# Patient Record
Sex: Female | Born: 1965 | Race: White | Hispanic: No | Marital: Single | Smoking: Current every day smoker
Health system: Southern US, Community
[De-identification: ages and names within clinical notes are randomized; demographics above are authoritative.]

## PROBLEM LIST (undated history)

## (undated) HISTORY — PX: CHOLECYSTECTOMY: SHX55

---

## 2010-01-19 ENCOUNTER — Emergency Department: Payer: Self-pay | Admitting: Emergency Medicine

## 2010-11-04 ENCOUNTER — Inpatient Hospital Stay: Payer: Self-pay | Admitting: Internal Medicine

## 2012-02-14 IMAGING — CR DG CHEST 2V
1 series · 2 of 2 positions shown · non-contrast
Comparison: none

REASON FOR EXAM: pain
COMMENTS:

[Series 1: view not recorded · 0.17mm/px · 2 of 2 slices shown]
[im 1/2]
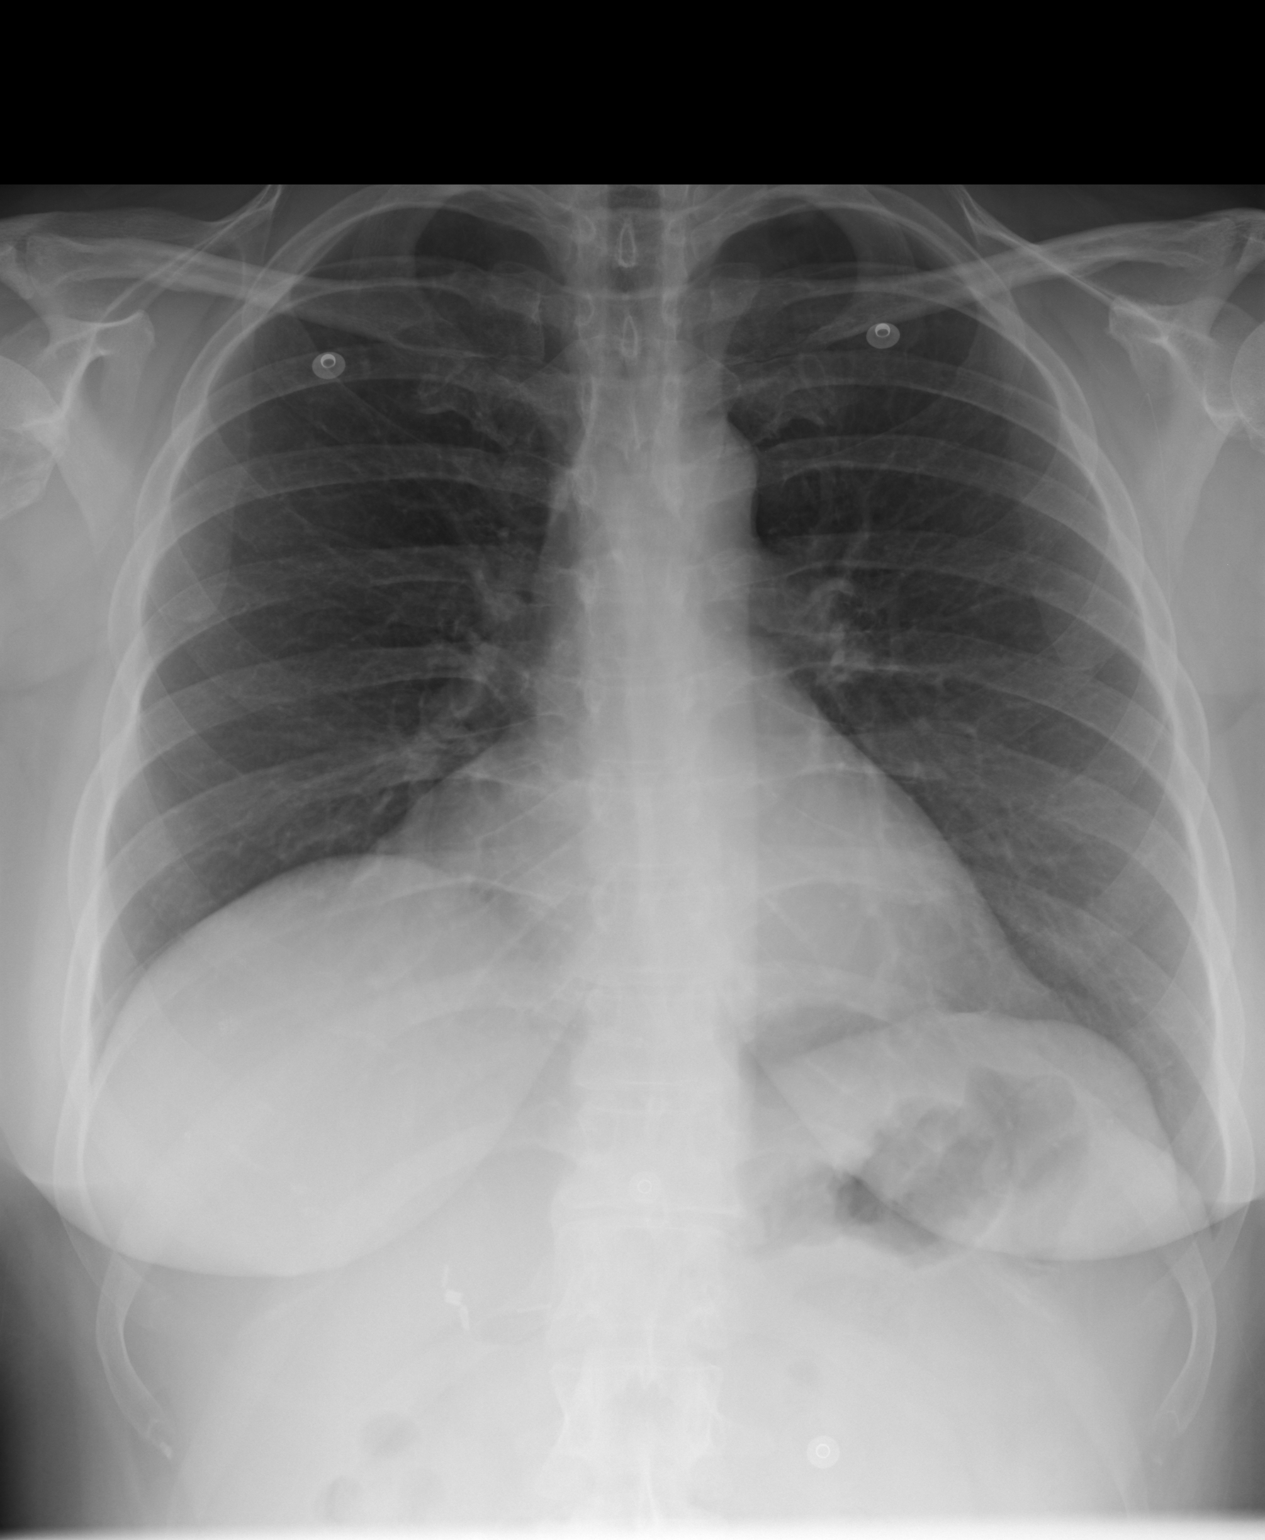
[im 2/2]
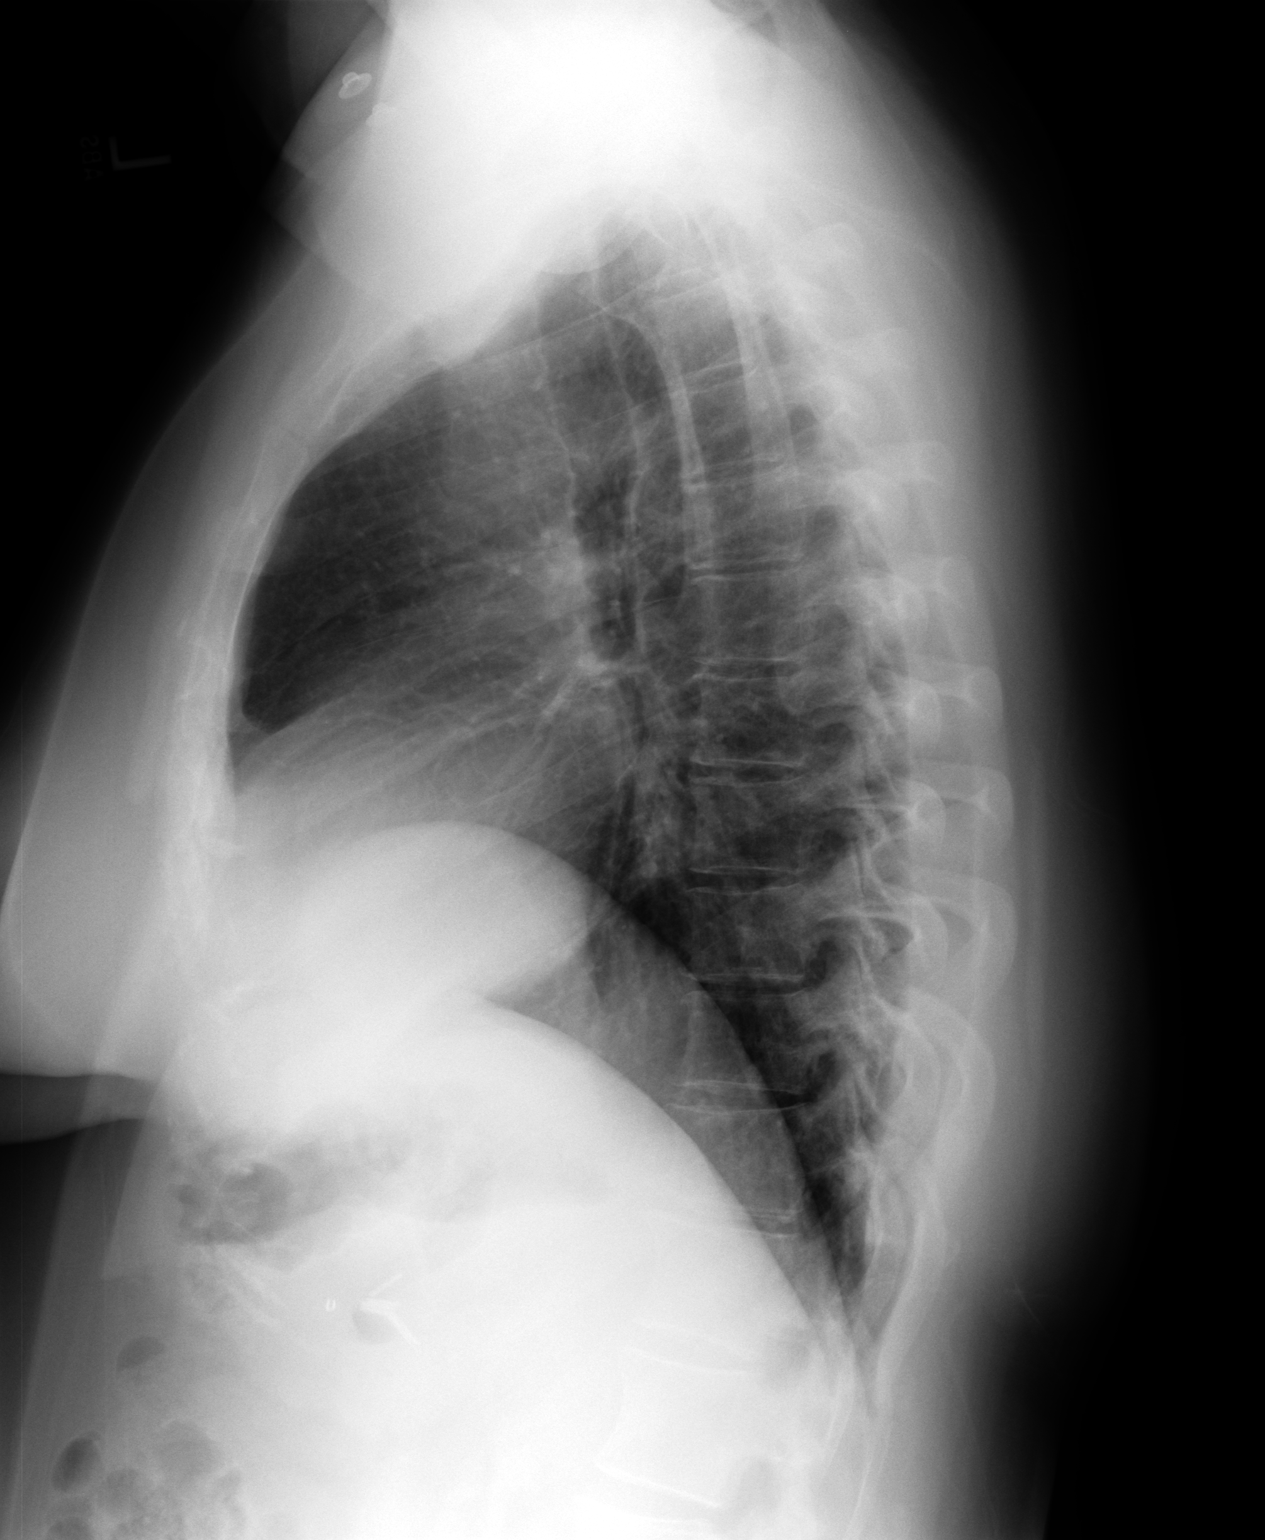

[2 of 2 positions shown; findings below may reference images not displayed]

PROCEDURE:     DXR - DXR CHEST PA (OR AP) AND LATERAL  - January 19, 2010  [DATE]

RESULT:     The patient has no previous examination for comparison.

The lungs are clear. The heart and pulmonary vessels are normal. The bony
and mediastinal structures are unremarkable. There is no effusion. There is
no pneumothorax or evidence of congestive failure.
IMPRESSION: No acute cardiopulmonary disease.

## 2014-01-08 ENCOUNTER — Emergency Department: Payer: Self-pay | Admitting: Emergency Medicine

## 2014-01-08 LAB — COMPREHENSIVE METABOLIC PANEL
ANION GAP: 6 — AB (ref 7–16)
Albumin: 3.6 g/dL (ref 3.4–5.0)
Alkaline Phosphatase: 47 U/L
BUN: 5 mg/dL — ABNORMAL LOW (ref 7–18)
Bilirubin,Total: 0.4 mg/dL (ref 0.2–1.0)
Calcium, Total: 8.2 mg/dL — ABNORMAL LOW (ref 8.5–10.1)
Chloride: 106 mmol/L (ref 98–107)
Co2: 25 mmol/L (ref 21–32)
Creatinine: 0.9 mg/dL (ref 0.60–1.30)
EGFR (African American): >60
EGFR (Non-African Amer.): >60
Glucose: 98 mg/dL (ref 65–99)
Osmolality: 271 (ref 275–301)
Potassium: 3.3 mmol/L — ABNORMAL LOW (ref 3.5–5.1)
SGOT(AST): 27 U/L (ref 15–37)
SGPT (ALT): 31 U/L (ref 12–78)
SODIUM: 137 mmol/L (ref 136–145)
Total Protein: 6.8 g/dL (ref 6.4–8.2)

## 2014-01-08 LAB — CBC
HCT: 42.8 % (ref 35.0–47.0)
HGB: 14.8 g/dL (ref 12.0–16.0)
MCH: 34 pg (ref 26.0–34.0)
MCHC: 34.7 g/dL (ref 32.0–36.0)
MCV: 98 fL (ref 80–100)
PLATELETS: 250 10*3/uL (ref 150–440)
RBC: 4.37 10*6/uL (ref 3.80–5.20)
RDW: 12.8 % (ref 11.5–14.5)
WBC: 6.8 10*3/uL (ref 3.6–11.0)

## 2014-01-08 LAB — TROPONIN I: Troponin-I: <0.02 ng/mL

## 2015-05-19 ENCOUNTER — Encounter: Payer: Self-pay | Admitting: Emergency Medicine

## 2015-05-19 ENCOUNTER — Emergency Department
Admission: EM | Admit: 2015-05-19 | Discharge: 2015-05-19 | Disposition: A | Discharge status: Home / Self Care | Payer: Self-pay | Attending: Emergency Medicine | Admitting: Emergency Medicine

## 2015-05-19 DIAGNOSIS — Z79899 Other long term (current) drug therapy: Secondary | ICD-10-CM | POA: Insufficient documentation

## 2015-05-19 DIAGNOSIS — Z3202 Encounter for pregnancy test, result negative: Secondary | ICD-10-CM | POA: Insufficient documentation

## 2015-05-19 DIAGNOSIS — Z7982 Long term (current) use of aspirin: Secondary | ICD-10-CM | POA: Insufficient documentation

## 2015-05-19 DIAGNOSIS — Z720 Tobacco use: Secondary | ICD-10-CM | POA: Insufficient documentation

## 2015-05-19 DIAGNOSIS — N12 Tubulo-interstitial nephritis, not specified as acute or chronic: Secondary | ICD-10-CM | POA: Insufficient documentation

## 2015-05-19 LAB — CBC
HEMATOCRIT: 43.6 % (ref 35.0–47.0)
HEMOGLOBIN: 14.9 g/dL (ref 12.0–16.0)
MCH: 34.1 pg — AB (ref 26.0–34.0)
MCHC: 34.2 g/dL (ref 32.0–36.0)
MCV: 99.9 fL (ref 80.0–100.0)
PLATELETS: 197 10*3/uL (ref 150–440)
RBC: 4.36 MIL/uL (ref 3.80–5.20)
RDW: 13.6 % (ref 11.5–14.5)
WBC: 8.1 10*3/uL (ref 3.6–11.0)

## 2015-05-19 LAB — URINALYSIS COMPLETE WITH MICROSCOPIC (ARMC ONLY)
BILIRUBIN URINE: NEGATIVE
Glucose, UA: NEGATIVE mg/dL
Ketones, ur: NEGATIVE mg/dL
Nitrite: NEGATIVE
Protein, ur: 30 mg/dL — AB
Specific Gravity, Urine: 1.018 (ref 1.005–1.030)
pH: 5 (ref 5.0–8.0)

## 2015-05-19 LAB — COMPREHENSIVE METABOLIC PANEL
ALBUMIN: 3.9 g/dL (ref 3.5–5.0)
ALT: 48 U/L (ref 14–54)
AST: 48 U/L — AB (ref 15–41)
Alkaline Phosphatase: 60 U/L (ref 38–126)
Anion gap: 13 (ref 5–15)
BUN: 12 mg/dL (ref 6–20)
CALCIUM: 9.2 mg/dL (ref 8.9–10.3)
CO2: 26 mmol/L (ref 22–32)
CREATININE: 1.03 mg/dL — AB (ref 0.44–1.00)
Chloride: 99 mmol/L — ABNORMAL LOW (ref 101–111)
GFR calc Af Amer: >60 mL/min (ref >60)
Glucose, Bld: 116 mg/dL — ABNORMAL HIGH (ref 65–99)
Potassium: 3.1 mmol/L — ABNORMAL LOW (ref 3.5–5.1)
Sodium: 138 mmol/L (ref 135–145)
Total Bilirubin: 0.9 mg/dL (ref 0.3–1.2)
Total Protein: 7.3 g/dL (ref 6.5–8.1)

## 2015-05-19 LAB — LIPASE, BLOOD: Lipase: 26 U/L (ref 22–51)

## 2015-05-19 LAB — POCT PREGNANCY, URINE: Preg Test, Ur: NEGATIVE

## 2015-05-19 LAB — TROPONIN I: Troponin I: <0.03 ng/mL (ref <0.031)

## 2015-05-19 MED ORDER — SULFAMETHOXAZOLE-TRIMETHOPRIM 800-160 MG PO TABS: 1.0000 | ORAL_TABLET | Freq: Two times a day (BID) | ORAL | Status: AC

## 2015-05-19 MED ORDER — CEFTRIAXONE SODIUM 1 G IJ SOLR
INTRAMUSCULAR | Status: AC
  Administered 2015-05-19: 1 g via INTRAMUSCULAR
  Filled 2015-05-19: qty 10

## 2015-05-19 MED ORDER — CEFTRIAXONE SODIUM 1 G IJ SOLR
1.0000 g | Freq: Once | INTRAMUSCULAR | Status: AC
  Administered 2015-05-19: 1 g via INTRAMUSCULAR
  Filled 2015-05-19: qty 10

## 2015-05-19 NOTE — ED Notes (Signed)
Pt to triage via w/c with no distress noted; pt reports since yesterday "feeling like I got a buzz; shocks going thru my head"; reports fever of "106" PTA; took 2ES tylenol at 0110; also c/o abd cramping, chronic lower back pain radiating into legs, dizziness, weakness

## 2015-05-19 NOTE — Discharge Instructions (Signed)
You're being treated for urinary tract/kidney infection. Return to the emergency department for any new or worsening condition including abdominal pain, inability to urinate, dizziness or passing out, confusion or altered mental status, or any other  symptoms concerning to you.   Pyelonephritis, Adult Pyelonephritis is a kidney infection. A kidney infection can happen quickly, or it can last for a long time. HOME CARE   Take your medicine (antibiotics) as told. Finish it even if you start to feel better.  Keep all doctor visits as told.  Drink enough fluids to keep your pee (urine) clear or pale yellow.  Only take medicine as told by your doctor. GET HELP RIGHT AWAY IF:   You have a fever or lasting symptoms for more than 2-3 days.  You have a fever and your symptoms suddenly get worse.  You cannot take your medicine or drink fluids as told.  You have chills and shaking.  You feel very weak or pass out (faint).  You do not feel better after 2 days. MAKE SURE YOU:  Understand these instructions.  Will watch your condition.  Will get help right away if you are not doing well or get worse. Document Released: 11/02/2004 Document Revised: 03/26/2012 Document Reviewed: 03/15/2011 [facility] Patient Information 2015 [facility] [place], [place]. This information is not intended to replace advice given to you by your health care provider. Make sure you discuss any questions you have with your health care provider.

## 2015-05-19 NOTE — ED Provider Notes (Signed)
[facility] Emergency Department Provider Note   ____________________________________________  Time seen: 7 AM I have reviewed the triage vital signs and the triage nursing note.  HISTORY  Chief Complaint Dizziness; Fever; Headache; Back Pain; and Abdominal Pain   Historian Patient  HPI Debra Drake is a 49 y.o. female who has had 2 days of fever subjectively, chills, headache, and bilateral flank pain associated also with dysuria. Last night she felt worse and she came in for evaluation. She has had urinary tract/kidney infections in the past. She has had an episode of kidney failure in the past. No shortness of breath, upper abdominal pain, or chest pain. No confusion or altered mental status. No weakness or numbness.    History reviewed. No pertinent past medical history. episode of kidney failure, urinary tract infections  There are no active problems to display for this patient.   Past Surgical History  Procedure Laterality Date  . Cholecystectomy      Current Outpatient Rx  Name  Route  Sig  Dispense  Refill  . aspirin 325 MG tablet   Oral   Take 325 mg by mouth daily.         . B Complex-Biotin-FA (SUPER B-100 PO)   Oral   Take 1 tablet by mouth daily.         . citalopram (CELEXA) 40 MG tablet   Oral   Take 40 mg by mouth daily.         . clonazePAM (KLONOPIN) 1 MG tablet   Oral   Take 1 mg by mouth 2 (two) times daily as needed for anxiety.         . metoprolol (LOPRESSOR) 50 MG tablet   Oral   Take 50 mg by mouth 2 (two) times daily.         . Multiple Vitamin (MULTIVITAMIN) tablet   Oral   Take 1 tablet by mouth daily.         . ranitidine (ZANTAC) 300 MG tablet   Oral   Take 300 mg by mouth at bedtime.         . triamterene-hydrochlorothiazide (MAXZIDE-25) 37.5-25 MG per tablet   Oral   Take 1 tablet by mouth daily.         Marland Kitchen sulfamethoxazole-trimethoprim (BACTRIM DS,SEPTRA DS) 800-160 MG per tablet   Oral   Take 1 tablet by mouth 2 (two) times daily.   20 tablet   0     Allergies Codeine and Ibuprofen  No family history on file.  Social History Social History  Substance Use Topics  . Smoking status: Current Every Day Smoker -- 1.00 packs/day    Types: Cigarettes  . Smokeless tobacco: None  . Alcohol Use: No   patient is attending night school  Review of Systems  Constitutional: Positive for fever and chills. Eyes: Negative for visual changes. ENT: Negative for sore throat. Cardiovascular: Negative for chest pain. Respiratory: Negative for shortness of breath. Gastrointestinal: Negative for vomiting and diarrhea. Genitourinary: Positive for dysuria. Musculoskeletal: Positive for muscle aches in the arms.. Skin: Negative for rash. Neurological: Negative for headaches, focal weakness or numbness. 10 point Review of Systems otherwise negative ____________________________________________   PHYSICAL EXAM:  VITAL SIGNS: ED Triage Vitals  Enc Vitals Group     BP 05/19/15 0247 132/79 mmHg     Pulse Rate 05/19/15 0247 116     Resp 05/19/15 0247 18     Temp 05/19/15 0247 98.3 F (36.8 C)  Temp Source 05/19/15 0247 Oral     SpO2 05/19/15 0247 99 %     Weight 05/19/15 0247 180 lb (81.647 kg)     Height 05/19/15 0247 5\' 5"  (1.651 m)     Head Cir --      Peak Flow --      Pain Score 05/19/15 0253 8     Pain Loc --      Pain Edu? --      Excl. in GC? --      Constitutional: Alert and oriented. Well appearing and in no distress. Eyes: Conjunctivae are normal. PERRL. Normal extraocular movements. ENT   Head: Normocephalic and atraumatic.   Nose: No congestion/rhinnorhea.   Mouth/Throat: Mucous membranes are moist.   Neck: No stridor. No meningismus. Cardiovascular/Chest: Normal rate, regular rhythm.  No murmurs, rubs, or gallops. Respiratory: Normal respiratory effort without tachypnea nor retractions. Breath sounds are clear and equal  bilaterally. No wheezes/rales/rhonchi. Gastrointestinal: Soft. No distention, no guarding, no rebound. Nontender . Obese. Mild CVA tenderness bilaterally.  Genitourinary/rectal:Deferred Musculoskeletal: Nontender with normal range of motion in all extremities. No joint effusions.  No lower extremity tenderness nor edema. Neurologic:  Normal speech and language. No gross or focal neurologic deficits are appreciated. Skin:  Skin is warm, dry and intact. No rash noted. Psychiatric: Mood and affect are normal. Speech and behavior are normal. Patient exhibits appropriate insight and judgment.  ____________________________________________   EKG I, Governor Rooks, MD, the attending physician have personally viewed and interpreted all ECGs.  Sinus tachycardia. 109 bpm. Normal axis. Narrow QRS. Occasional PVC. Nonspecific T wave. ____________________________________________  LABS (pertinent positives/negatives)  Urine pregnancy test negative Electrolyte panel significant for potassium 3.1, and otherwise without significant abnormality White blood count 8.1, hemoglobin 14.9, platelets 197 Urinalysis significant for tubal subsides, 6-30 red blood cells, too numerous to count white blood cells, many bacteria  ____________________________________________  RADIOLOGY All Xrays were viewed by me. Imaging interpreted by Radiologist.  None __________________________________________  PROCEDURES  Procedure(s) performed: None Critical Care performed: None  ____________________________________________   ED COURSE / ASSESSMENT AND PLAN  CONSULTATIONS: None  Pertinent labs & imaging results that were available during my care of the patient were reviewed by me and considered in my medical decision making (see chart for details).   Patient is overall well-appearing with a heart rate in the 80s when I saw her. Reported history of fever and chills, and a urinalysis consistent with urinary tract  infection. I did treat her for pyelonephritis with flank pain. No fever here, no early white blood cell count, well-appearing overall, no hypertension, for which she is safe and stable for outpatient management. I am dose of Rocephin was given in the emergency department. Followed by a prescription for Bactrim. Urine culture was sent.    Patient / Family / Caregiver informed of clinical course, medical decision-making process, and agree with plan.   I discussed return precautions, follow-up instructions, and discharged instructions with patient and/or family.  ___________________________________________   FINAL CLINICAL IMPRESSION(S) / ED DIAGNOSES   Final diagnoses:  Pyelonephritis    FOLLOW UP  Referred to: Primary care physician, one week   Governor Rooks, MD 05/19/15 [phone]

## 2015-05-21 LAB — URINE CULTURE

## 2016-02-03 IMAGING — CR DG CHEST 2V
1 series · 2 of 2 positions shown · non-contrast
Comparison: Chest radiograph performed 01/19/2010

CLINICAL DATA: Mid chest pain and cough.

EXAM:
CHEST  2 VIEW

[Series 1: w chest pa · 0.14mm/px · 2 of 2 slices shown]
[im 1/2]
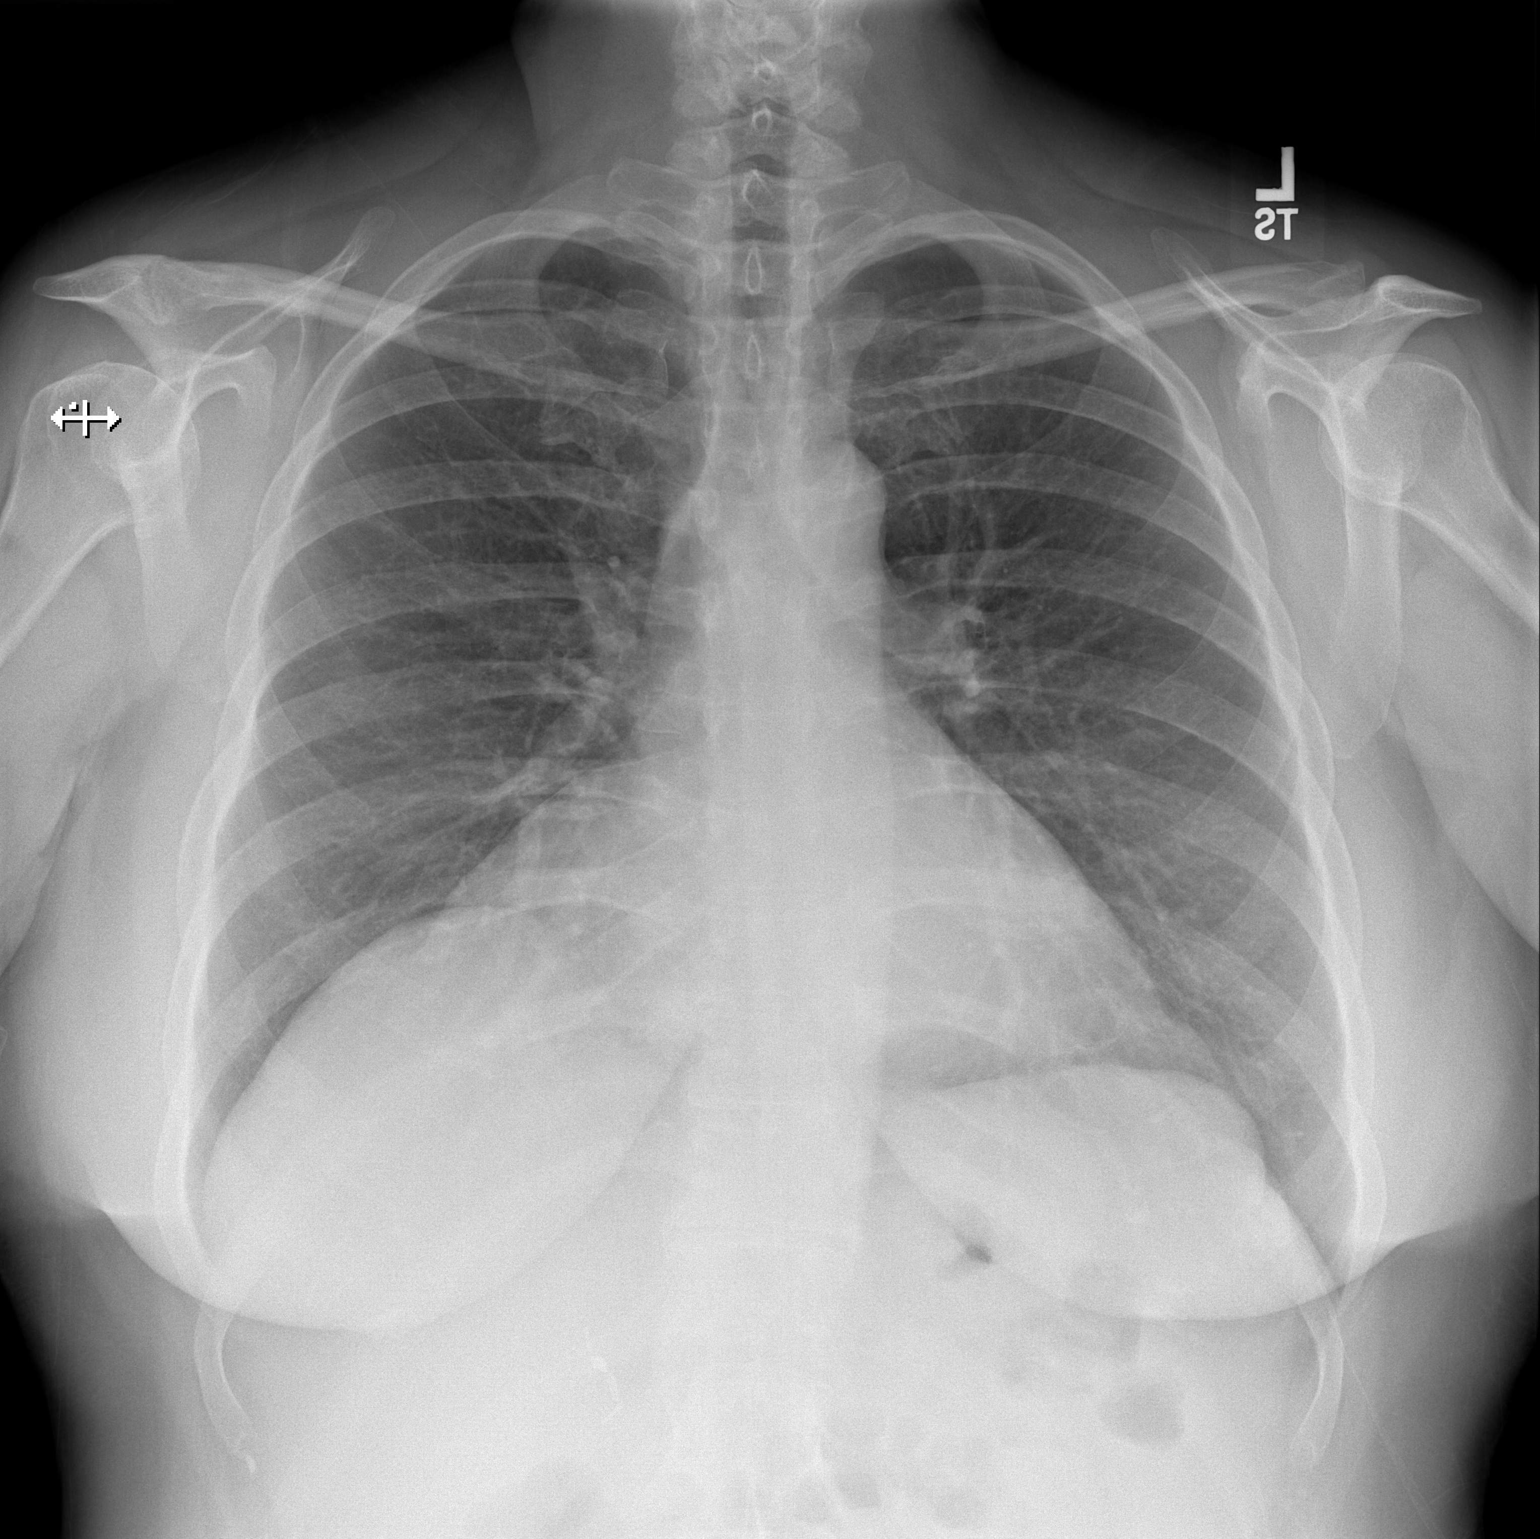
[im 2/2]
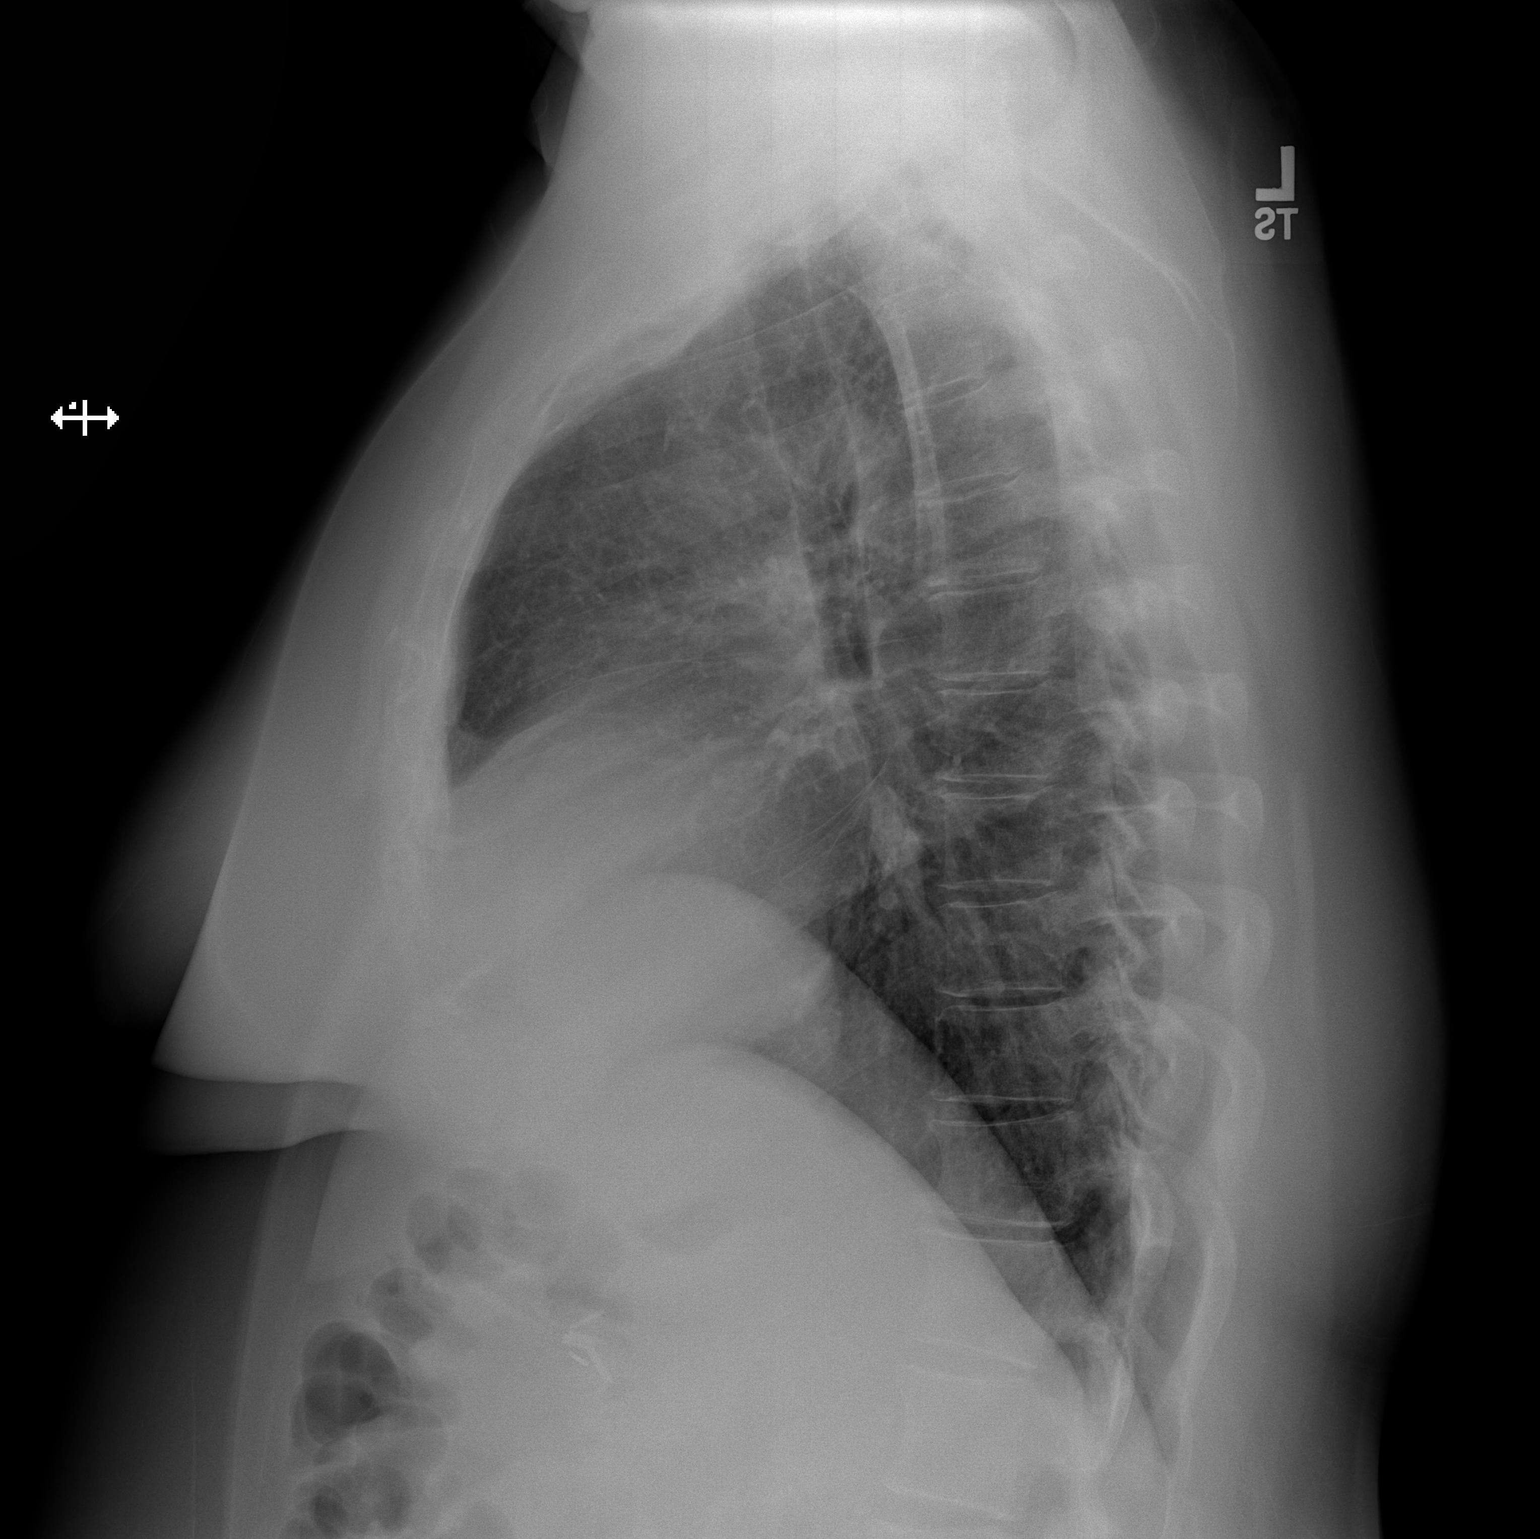

[2 of 2 positions shown; findings below may reference images not displayed]

FINDINGS: The lungs are well-aerated and clear. There is no evidence of focal
opacification, pleural effusion or pneumothorax.

The heart is mildly enlarged. No acute osseous abnormalities are
seen. Clips are noted within the right upper quadrant, reflecting
prior cholecystectomy.
IMPRESSION: No acute cardiopulmonary process seen; mild cardiomegaly noted.

## 2017-09-10 ENCOUNTER — Other Ambulatory Visit: Payer: Self-pay

## 2017-09-10 ENCOUNTER — Emergency Department
Admission: EM | Admit: 2017-09-10 | Discharge: 2017-09-10 | Disposition: A | Discharge status: Home / Self Care | Payer: Self-pay | Attending: Student in an Organized Health Care Education/Training Program | Admitting: Student in an Organized Health Care Education/Training Program

## 2017-09-10 DIAGNOSIS — Z79899 Other long term (current) drug therapy: Secondary | ICD-10-CM | POA: Insufficient documentation

## 2017-09-10 DIAGNOSIS — F17210 Nicotine dependence, cigarettes, uncomplicated: Secondary | ICD-10-CM | POA: Insufficient documentation

## 2017-09-10 DIAGNOSIS — N12 Tubulo-interstitial nephritis, not specified as acute or chronic: Secondary | ICD-10-CM

## 2017-09-10 DIAGNOSIS — N10 Acute tubulo-interstitial nephritis: Secondary | ICD-10-CM | POA: Insufficient documentation

## 2017-09-10 LAB — CBC WITH DIFFERENTIAL/PLATELET
BASOS ABS: 0 10*3/uL (ref 0–0.1)
BASOS PCT: 0 %
EOS ABS: 0 10*3/uL (ref 0–0.7)
EOS PCT: 0 %
HCT: 43.9 % (ref 35.0–47.0)
Hemoglobin: 15.5 g/dL (ref 12.0–16.0)
Lymphocytes Relative: 6 %
Lymphs Abs: 0.8 10*3/uL — ABNORMAL LOW (ref 1.0–3.6)
MCH: 34.3 pg — ABNORMAL HIGH (ref 26.0–34.0)
MCHC: 35.2 g/dL (ref 32.0–36.0)
MCV: 97.4 fL (ref 80.0–100.0)
MONO ABS: 1.4 10*3/uL — AB (ref 0.2–0.9)
MONOS PCT: 9 %
Neutro Abs: 12.7 10*3/uL — ABNORMAL HIGH (ref 1.4–6.5)
Neutrophils Relative %: 85 %
PLATELETS: 206 10*3/uL (ref 150–440)
RBC: 4.51 MIL/uL (ref 3.80–5.20)
RDW: 13.1 % (ref 11.5–14.5)
WBC: 15 10*3/uL — ABNORMAL HIGH (ref 3.6–11.0)

## 2017-09-10 LAB — URINALYSIS, COMPLETE (UACMP) WITH MICROSCOPIC
Bilirubin Urine: NEGATIVE
GLUCOSE, UA: NEGATIVE mg/dL
Ketones, ur: NEGATIVE mg/dL
NITRITE: NEGATIVE
PROTEIN: 100 mg/dL — AB
SPECIFIC GRAVITY, URINE: 1.016 (ref 1.005–1.030)
pH: 5 (ref 5.0–8.0)

## 2017-09-10 LAB — COMPREHENSIVE METABOLIC PANEL
ALT: 41 U/L (ref 14–54)
ANION GAP: 11 (ref 5–15)
AST: 34 U/L (ref 15–41)
Albumin: 4 g/dL (ref 3.5–5.0)
Alkaline Phosphatase: 54 U/L (ref 38–126)
BUN: 17 mg/dL (ref 6–20)
CALCIUM: 9.2 mg/dL (ref 8.9–10.3)
CO2: 25 mmol/L (ref 22–32)
Chloride: 97 mmol/L — ABNORMAL LOW (ref 101–111)
Creatinine, Ser: 1.34 mg/dL — ABNORMAL HIGH (ref 0.44–1.00)
GFR calc Af Amer: 52 mL/min — ABNORMAL LOW (ref >60)
GFR calc non Af Amer: 45 mL/min — ABNORMAL LOW (ref >60)
GLUCOSE: 135 mg/dL — AB (ref 65–99)
Potassium: 3.2 mmol/L — ABNORMAL LOW (ref 3.5–5.1)
SODIUM: 133 mmol/L — AB (ref 135–145)
TOTAL PROTEIN: 7.8 g/dL (ref 6.5–8.1)
Total Bilirubin: 1.7 mg/dL — ABNORMAL HIGH (ref 0.3–1.2)

## 2017-09-10 LAB — LACTIC ACID, PLASMA: Lactic Acid, Venous: 1.8 mmol/L (ref 0.5–1.9)

## 2017-09-10 MED ORDER — CEPHALEXIN 500 MG PO CAPS
500.0000 mg | ORAL_CAPSULE | Freq: Three times a day (TID) | ORAL | 0 refills | Status: AC
Start: 2017-09-10 — End: 2017-09-17

## 2017-09-10 MED ORDER — PROMETHAZINE HCL 12.5 MG PO TABS: 12.5000 mg | ORAL_TABLET | Freq: Four times a day (QID) | ORAL | 0 refills | Status: AC | PRN

## 2017-09-10 MED ORDER — PROMETHAZINE HCL 25 MG/ML IJ SOLN: 12.5000 mg | Freq: Four times a day (QID) | INTRAMUSCULAR | Status: DC | PRN

## 2017-09-10 MED ORDER — CEFTRIAXONE SODIUM IN DEXTROSE 20 MG/ML IV SOLN
1.0000 g | Freq: Once | INTRAVENOUS | Status: AC
  Administered 2017-09-10: 1 g via INTRAVENOUS
  Filled 2017-09-10: qty 50

## 2017-09-10 MED ORDER — SODIUM CHLORIDE 0.9 % IV BOLUS (SEPSIS)
1000.0000 mL | Freq: Once | INTRAVENOUS | Status: AC
  Administered 2017-09-10: 1000 mL via INTRAVENOUS

## 2017-09-10 NOTE — ED Notes (Signed)
Pt states she is currently having right and left flank pain that is tender to touch.

## 2017-09-10 NOTE — ED Triage Notes (Addendum)
Pt states she has had UTI X 1 week. Fever and chills at home. Tylenol taken approx 2 hours PTA. Pt has sweat beads on forehead. Bilateral flank pain. Fever of 105F oral at home per patient report.

## 2017-09-10 NOTE — ED Provider Notes (Signed)
[facility] Emergency Department Provider Note    None    (approximate)  I have reviewed the triage vital signs and the nursing notes.   HISTORY  Chief Complaint Urinary Tract Infection    HPI Debra Drake is a 51 y.o. female presents with 1 week of worsening dysuria progressing to bilateral flank pain associated with nausea and vomiting.  States the pain is an aching sensation is mild to moderate.  States she has been having fevers and chills for the last 24 hours.  No cough or shortness of breath.  Denies any diarrhea.  No recent antibiotics.  States she has had similar infections in the past.  Has been trying to self medicate with cranberry juice and lemon juice without improvement.  History reviewed. No pertinent past medical history. No family history on file. Past Surgical History:  Procedure Laterality Date  . CHOLECYSTECTOMY     There are no active problems to display for this patient.     Prior to Admission medications   Medication Sig Start Date End Date Taking? Authorizing Provider  aspirin 325 MG tablet Take 325 mg by mouth daily.    [provider]  B Complex-Biotin-FA (SUPER B-100 PO) Take 1 tablet by mouth daily.    [provider]  cephALEXin (KEFLEX) 500 MG capsule Take 1 capsule (500 mg total) by mouth 3 (three) times daily for 7 days. 09/10/17 09/17/17  Willy Eddy, MD  citalopram (CELEXA) 40 MG tablet Take 40 mg by mouth daily.    [provider]  clonazePAM (KLONOPIN) 1 MG tablet Take 1 mg by mouth 2 (two) times daily as needed for anxiety.    [provider]  metoprolol (LOPRESSOR) 50 MG tablet Take 50 mg by mouth 2 (two) times daily.    [provider]  Multiple Vitamin (MULTIVITAMIN) tablet Take 1 tablet by mouth daily.    [provider]  promethazine (PHENERGAN) 12.5 MG tablet Take 1 tablet (12.5 mg total) by mouth every 6 (six) hours as needed for nausea or vomiting.  09/10/17   Willy Eddy, MD  ranitidine (ZANTAC) 300 MG tablet Take 300 mg by mouth at bedtime.    [provider]  sulfamethoxazole-trimethoprim (BACTRIM DS,SEPTRA DS) 800-160 MG per tablet Take 1 tablet by mouth 2 (two) times daily. 05/19/15   Governor Rooks, MD  triamterene-hydrochlorothiazide (MAXZIDE-25) 37.5-25 MG per tablet Take 1 tablet by mouth daily.    [provider]    Allergies Codeine and Ibuprofen    Social History Social History   Tobacco Use  . Smoking status: Current Every Day Smoker    Packs/day: 1.00    Types: Cigarettes  Substance Use Topics  . Alcohol use: No  . Drug use: Not on file    Review of Systems Patient denies headaches, rhinorrhea, blurry vision, numbness, shortness of breath, chest pain, edema, cough, abdominal pain, nausea, vomiting, diarrhea, dysuria, fevers, rashes or hallucinations unless otherwise stated above in HPI. ____________________________________________   PHYSICAL EXAM:  VITAL SIGNS: Vitals:   09/10/17 1825  BP: 130/83  Pulse: (!) 104  Resp: 18  Temp: 98.7 F (37.1 C)  SpO2: 98%    Constitutional: Alert and oriented. Well appearing and in no acute distress. Eyes: Conjunctivae are normal.  Head: Atraumatic. Nose: No congestion/rhinnorhea. Mouth/Throat: Mucous membranes are moist.   Neck: No stridor. Painless ROM.  Cardiovascular: Normal rate, regular rhythm. Grossly normal heart sounds.  Good peripheral circulation. Respiratory: Normal respiratory effort.  No retractions. Lungs CTAB. Gastrointestinal: Soft and nontender. No distention. No abdominal bruits. Bilateral CVA ttp Genitourinary:  Musculoskeletal: No lower extremity tenderness nor edema.  No joint effusions. Neurologic:  Normal speech and language. No gross focal neurologic deficits are appreciated. No facial droop Skin:  Skin is warm, dry and intact. No rash noted. Psychiatric: Mood and affect are normal. Speech and behavior are  normal.  ____________________________________________   LABS (all labs ordered are listed, but only abnormal results are displayed)  Results for orders placed or performed during the hospital encounter of 09/10/17 (from the past 24 hour(s))  Lactic acid, plasma     Status: None   Collection Time: 09/10/17  6:25 PM  Result Value Ref Range   Lactic Acid, Venous 1.8 0.5 - 1.9 mmol/L  Comprehensive metabolic panel     Status: Abnormal   Collection Time: 09/10/17  6:25 PM  Result Value Ref Range   Sodium 133 (L) 135 - 145 mmol/L   Potassium 3.2 (L) 3.5 - 5.1 mmol/L   Chloride 97 (L) 101 - 111 mmol/L   CO2 25 22 - 32 mmol/L   Glucose, Bld 135 (H) 65 - 99 mg/dL   BUN 17 6 - 20 mg/dL   Creatinine, Ser 5.361.34 (H) 0.44 - 1.00 mg/dL   Calcium 9.2 8.9 - 64.410.3 mg/dL   Total Protein 7.8 6.5 - 8.1 g/dL   Albumin 4.0 3.5 - 5.0 g/dL   AST 34 15 - 41 U/L   ALT 41 14 - 54 U/L   Alkaline Phosphatase 54 38 - 126 U/L   Total Bilirubin 1.7 (H) 0.3 - 1.2 mg/dL   GFR calc non Af Amer 45 (L) >60 mL/min   GFR calc Af Amer 52 (L) >60 mL/min   Anion gap 11 5 - 15  CBC with Differential     Status: Abnormal   Collection Time: 09/10/17  6:25 PM  Result Value Ref Range   WBC 15.0 (H) 3.6 - 11.0 K/uL   RBC 4.51 3.80 - 5.20 MIL/uL   Hemoglobin 15.5 12.0 - 16.0 g/dL   HCT 03.443.9 74.235.0 - 59.547.0 %   MCV 97.4 80.0 - 100.0 fL   MCH 34.3 (H) 26.0 - 34.0 pg   MCHC 35.2 32.0 - 36.0 g/dL   RDW 63.813.1 75.611.5 - 43.314.5 %   Platelets 206 150 - 440 K/uL   Neutrophils Relative % 85 %   Neutro Abs 12.7 (H) 1.4 - 6.5 K/uL   Lymphocytes Relative 6 %   Lymphs Abs 0.8 (L) 1.0 - 3.6 K/uL   Monocytes Relative 9 %   Monocytes Absolute 1.4 (H) 0.2 - 0.9 K/uL   Eosinophils Relative 0 %   Eosinophils Absolute 0.0 0 - 0.7 K/uL   Basophils Relative 0 %   Basophils Absolute 0.0 0 - 0.1 K/uL  Urinalysis, Complete w Microscopic     Status: Abnormal   Collection Time: 09/10/17  6:25 PM  Result Value Ref Range   Color, Urine AMBER (A)  YELLOW   APPearance CLOUDY (A) CLEAR   Specific Gravity, Urine 1.016 1.005 - 1.030   pH 5.0 5.0 - 8.0   Glucose, UA NEGATIVE NEGATIVE mg/dL   Hgb urine dipstick SMALL (A) NEGATIVE   Bilirubin Urine NEGATIVE NEGATIVE   Ketones, ur NEGATIVE NEGATIVE mg/dL   Protein, ur 295100 (A) NEGATIVE mg/dL   Nitrite NEGATIVE NEGATIVE   Leukocytes, UA MODERATE (A) NEGATIVE   RBC / HPF 6-30 0 - 5 RBC/hpf  WBC, UA TOO NUMEROUS TO COUNT 0 - 5 WBC/hpf   Bacteria, UA RARE (A) NONE SEEN   Squamous Epithelial / LPF TOO NUMEROUS TO COUNT (A) NONE SEEN   Mucus PRESENT    Non Squamous Epithelial 0-5 (A) NONE SEEN   ____________________________________________  EKG ____________________________________________  RADIOLOGY  ____________________________________________   PROCEDURES  Procedure(s) performed:  Procedures    Critical Care performed: no ____________________________________________   INITIAL IMPRESSION / ASSESSMENT AND PLAN / ED COURSE  Pertinent labs & imaging results that were available during my care of the patient were reviewed by me and considered in my medical decision making (see chart for details).  DDX: uti, pyelo, stone, colitis, flu like illness  Debra Drake is a 51 y.o. who presents to the ED with dysuria, flank pain and subjective fevers and chills at home.  Presentation does appear consistent with pyelonephritis.  Her abdominal exam is soft and benign.  Blood work shows leukocytosis but no lactic acidosis or metabolic derangement.  Urinalysis does show evidence of infection. Will get bolus of IVF and dose of IV rocephin for treatment of pyelonephritis.  Patient otherwise well-appearing and seems to be mild pyelonephritis clinically.  Previous urinalysis did grow pansensitive E. coli therefore do suspect patient will respond well to Keflex.  Patient will be discharged with oral antibiotics with strict return precautions.  Will send urine culture for further evaluation.  Have  discussed with the patient and available family all diagnostics and treatments performed thus far and all questions were answered to the best of my ability. The patient demonstrates understanding and agreement with plan.       ____________________________________________   FINAL CLINICAL IMPRESSION(S) / ED DIAGNOSES  Final diagnoses:  Pyelonephritis      NEW MEDICATIONS STARTED DURING THIS VISIT:  This SmartLink is deprecated. Use AVSMEDLIST instead to display the medication list for a patient.   Note:  This document was prepared using Dragon voice recognition software and may include unintentional dictation errors.    Willy Eddyobinson, Deedee Lybarger, MD 09/10/17 [phone]

## 2017-09-13 LAB — URINE CULTURE: Culture: >=100000 — AB

## 2022-06-02 ENCOUNTER — Ambulatory Visit (LOCAL_COMMUNITY_HEALTH_CENTER): Payer: Self-pay

## 2022-06-02 ENCOUNTER — Other Ambulatory Visit: Payer: Self-pay

## 2022-06-02 DIAGNOSIS — Z111 Encounter for screening for respiratory tuberculosis: Secondary | ICD-10-CM

## 2022-06-05 ENCOUNTER — Ambulatory Visit (LOCAL_COMMUNITY_HEALTH_CENTER): Payer: Self-pay

## 2022-06-05 DIAGNOSIS — Z111 Encounter for screening for respiratory tuberculosis: Secondary | ICD-10-CM

## 2022-06-05 LAB — TB SKIN TEST
Induration: 0 mm
TB Skin Test: NEGATIVE

## 2022-10-12 DIAGNOSIS — I10 Essential (primary) hypertension: Secondary | ICD-10-CM | POA: Diagnosis not present

## 2022-11-01 DIAGNOSIS — E559 Vitamin D deficiency, unspecified: Secondary | ICD-10-CM | POA: Diagnosis not present

## 2022-11-01 DIAGNOSIS — E119 Type 2 diabetes mellitus without complications: Secondary | ICD-10-CM | POA: Diagnosis not present

## 2022-11-01 DIAGNOSIS — I10 Essential (primary) hypertension: Secondary | ICD-10-CM | POA: Diagnosis not present

## 2022-11-01 DIAGNOSIS — Z23 Encounter for immunization: Secondary | ICD-10-CM | POA: Diagnosis not present

## 2022-11-01 DIAGNOSIS — E785 Hyperlipidemia, unspecified: Secondary | ICD-10-CM | POA: Diagnosis not present

## 2022-11-01 DIAGNOSIS — F322 Major depressive disorder, single episode, severe without psychotic features: Secondary | ICD-10-CM | POA: Diagnosis not present

## 2022-11-01 DIAGNOSIS — Z1231 Encounter for screening mammogram for malignant neoplasm of breast: Secondary | ICD-10-CM | POA: Diagnosis not present

## 2022-11-01 DIAGNOSIS — F411 Generalized anxiety disorder: Secondary | ICD-10-CM | POA: Diagnosis not present

## 2022-11-01 DIAGNOSIS — Z1211 Encounter for screening for malignant neoplasm of colon: Secondary | ICD-10-CM | POA: Diagnosis not present

## 2022-11-01 DIAGNOSIS — F4310 Post-traumatic stress disorder, unspecified: Secondary | ICD-10-CM | POA: Diagnosis not present

## 2022-11-01 DIAGNOSIS — Z131 Encounter for screening for diabetes mellitus: Secondary | ICD-10-CM | POA: Diagnosis not present

## 2022-11-06 DIAGNOSIS — Z1231 Encounter for screening mammogram for malignant neoplasm of breast: Secondary | ICD-10-CM | POA: Diagnosis not present

## 2022-11-15 DIAGNOSIS — Z1211 Encounter for screening for malignant neoplasm of colon: Secondary | ICD-10-CM | POA: Diagnosis not present

## 2024-02-22 DIAGNOSIS — Z1211 Encounter for screening for malignant neoplasm of colon: Secondary | ICD-10-CM | POA: Diagnosis not present

## 2024-02-22 DIAGNOSIS — Z23 Encounter for immunization: Secondary | ICD-10-CM | POA: Diagnosis not present

## 2024-02-22 DIAGNOSIS — E785 Hyperlipidemia, unspecified: Secondary | ICD-10-CM | POA: Diagnosis not present

## 2024-02-22 DIAGNOSIS — M549 Dorsalgia, unspecified: Secondary | ICD-10-CM | POA: Diagnosis not present

## 2024-02-22 DIAGNOSIS — F411 Generalized anxiety disorder: Secondary | ICD-10-CM | POA: Diagnosis not present

## 2024-02-22 DIAGNOSIS — Z124 Encounter for screening for malignant neoplasm of cervix: Secondary | ICD-10-CM | POA: Diagnosis not present

## 2024-02-22 DIAGNOSIS — E559 Vitamin D deficiency, unspecified: Secondary | ICD-10-CM | POA: Diagnosis not present

## 2024-02-22 DIAGNOSIS — I10 Essential (primary) hypertension: Secondary | ICD-10-CM | POA: Diagnosis not present

## 2024-02-22 DIAGNOSIS — F322 Major depressive disorder, single episode, severe without psychotic features: Secondary | ICD-10-CM | POA: Diagnosis not present
